# Patient Record
Sex: Female | Born: 1980 | Race: Black or African American | Hispanic: No | Marital: Single | Smoking: Never smoker
Health system: Southern US, Community
[De-identification: ages and names within clinical notes are randomized; demographics above are authoritative.]

---

## 2013-05-09 ENCOUNTER — Other Ambulatory Visit (HOSPITAL_COMMUNITY)
Admission: RE | Admit: 2013-05-09 | Discharge: 2013-05-09 | Disposition: A | Discharge status: Home / Self Care | Payer: BC Managed Care – PPO | Source: Ambulatory Visit | Attending: Family Medicine | Admitting: Family Medicine

## 2013-05-09 DIAGNOSIS — Z113 Encounter for screening for infections with a predominantly sexual mode of transmission: Secondary | ICD-10-CM | POA: Insufficient documentation

## 2013-05-09 DIAGNOSIS — Z124 Encounter for screening for malignant neoplasm of cervix: Secondary | ICD-10-CM | POA: Insufficient documentation

## 2016-10-10 DIAGNOSIS — M791 Myalgia: Secondary | ICD-10-CM | POA: Insufficient documentation

## 2016-10-10 DIAGNOSIS — R509 Fever, unspecified: Secondary | ICD-10-CM | POA: Insufficient documentation

## 2016-10-11 ENCOUNTER — Emergency Department (HOSPITAL_COMMUNITY)
Admission: EM | Admit: 2016-10-11 | Discharge: 2016-10-11 | Disposition: A | Discharge status: Home / Self Care | Payer: Self-pay | Attending: Emergency Medicine | Admitting: Emergency Medicine

## 2016-10-11 ENCOUNTER — Encounter (HOSPITAL_COMMUNITY): Payer: Self-pay | Admitting: *Deleted

## 2016-10-11 ENCOUNTER — Emergency Department (HOSPITAL_COMMUNITY): Payer: Self-pay

## 2016-10-11 DIAGNOSIS — R6889 Other general symptoms and signs: Secondary | ICD-10-CM

## 2016-10-11 LAB — PREGNANCY, URINE: PREG TEST UR: NEGATIVE

## 2016-10-11 MED ORDER — ONDANSETRON 4 MG PO TBDP
4.0000 mg | ORAL_TABLET | Freq: Once | ORAL | Status: AC
  Administered 2016-10-11: 4 mg via ORAL
  Filled 2016-10-11: qty 1

## 2016-10-11 MED ORDER — ACETAMINOPHEN 325 MG PO TABS
650.0000 mg | ORAL_TABLET | Freq: Once | ORAL | Status: AC
Start: 2016-10-11 — End: 2016-10-11
  Administered 2016-10-11: 650 mg via ORAL
  Filled 2016-10-11: qty 2

## 2016-10-11 NOTE — ED Triage Notes (Signed)
Patient is alert and oriented x4. She is complaining of generalized body aches with fever.  Patient unable to determine fever without a thermometer.  Currently she rates her pain 8 of 10 with nausea.  Patient states that she has been around her niece that has been sick.

## 2016-10-11 NOTE — ED Provider Notes (Signed)
[facility]-EMERGENCY DEPT Provider Note   CSN: [id number] Arrival date & time: 10/10/16  2357    By signing my name below, I, Valentino SaxonBianca Contreras, attest that this documentation has been prepared under the direction and in the presence of Pricilla LovelessScott Cormick Moss, MD. Electronically Signed: Valentino SaxonBianca Contreras, ED Scribe. 10/11/16. 4:08 AM.  History   Chief Complaint Chief Complaint  Patient presents with  . Influenza   The history is provided by the patient. No language interpreter was used.   HPI Comments: Bonnie Martinez is a 36 y.o. female with no pertinent PMHx, who presents to the Emergency Department complaining of moderate, constant, generalized muscle aches onset five days ago. Pt reports associated CP, congestion, sore throat, subjective fever and appetite change accompanied by nausea. Pt's temperature in the ED was 99.9. Pt notes the Tylenol given in ED had minimal relief. She denies use of OTC medication at home for relief. Denies vomiting, cough, abdominal pain, HA. Per nurse note, pt states she has been around her sick niece at home.   History reviewed. No pertinent past medical history.  There are no active problems to display for this patient.   History reviewed. No pertinent surgical history.  OB History    No data available       Home Medications    Prior to Admission medications   Not on File    Family History History reviewed. No pertinent family history.  Social History Social History  Substance Use Topics  . Smoking status: Never Smoker  . Smokeless tobacco: Never Used  . Alcohol use Yes     Allergies   Patient has no known allergies.   Review of Systems Review of Systems  Constitutional: Positive for appetite change and fever.  HENT: Positive for congestion and sore throat.   Respiratory: Positive for cough.   Cardiovascular: Positive for chest pain.  Gastrointestinal: Positive for nausea. Negative for abdominal pain and vomiting.  Musculoskeletal:  Positive for myalgias.  Neurological: Negative for headaches.  All other systems reviewed and are negative.    Physical Exam Updated Vital Signs BP 107/75 (BP Location: Left Arm)   Pulse 87   Temp 100.6 F (38.1 C) (Oral)   Resp 18   Ht 5' 5.5" (1.664 m)   Wt 195 lb (88.5 kg)   LMP 10/07/2016   SpO2 96%   BMI 31.96 kg/m   Physical Exam  Constitutional: She is oriented to person, place, and time. She appears well-developed and well-nourished.  HENT:  Head: Normocephalic and atraumatic.  Right Ear: External ear normal.  Left Ear: External ear normal.  Nose: Nose normal.  Eyes: Right eye exhibits no discharge. Left eye exhibits no discharge.  Cardiovascular: Normal rate, regular rhythm and normal heart sounds.   Pulmonary/Chest: Effort normal and breath sounds normal. She has no wheezes. She exhibits tenderness (mild over strernum).  Abdominal: Soft. There is no tenderness.  Neurological: She is alert and oriented to person, place, and time.  Skin: Skin is warm and dry.  Nursing note and vitals reviewed.    ED Treatments / Results   DIAGNOSTIC STUDIES: Oxygen Saturation is 98% on RA, normal by my interpretation.    COORDINATION OF CARE: 3:54 AM Discussed treatment plan with pt at bedside which includes labs, chest imaging and analgesicand pt agreed to plan.   Labs (all labs ordered are listed, but only abnormal results are displayed) Labs Reviewed  PREGNANCY, URINE  POC URINE PREG, ED    EKG  EKG Interpretation None  Radiology Dg Chest 2 View  Result Date: 10/11/2016 CLINICAL DATA:  Cough, fever, body aches and chest congestion. EXAM: CHEST  2 VIEW COMPARISON:  None. FINDINGS: Cardiomediastinal silhouette is normal. No pleural effusions or focal consolidations. Trachea projects midline and there is no pneumothorax. Soft tissue planes and included osseous structures are non-suspicious. IMPRESSION: Normal chest. Electronically Signed   By: Awilda Metro M.D.   On: 10/11/2016 04:10    Procedures Procedures (including critical care time)  Medications Ordered in ED Medications  acetaminophen (TYLENOL) tablet 650 mg (650 mg Oral Given 10/11/16 0016)  ondansetron (ZOFRAN-ODT) disintegrating tablet 4 mg (4 mg Oral Given 10/11/16 0417)     Initial Impression / Assessment and Plan / ED Course  I have reviewed the triage vital signs and the nursing notes.  Pertinent labs & imaging results that were available during my care of the patient were reviewed by me and considered in my medical decision making (see chart for details).  Clinical Course as of Oct 11 654  Wed Oct 11, 2016  0354 Will get Xray to r/o PNA. Most likely influenza. No high risk features, sx now over 72 hours no indication for tamiflu. Zofran for nausea. Appears adequately hydrated.  [SG]  0527 No obvious pneumonia. Given steady state for 5 days this is most likely flu. Over 72 hours, no indication for tamiflu. Appears hydrated. Oral fluids, tylenol/ibuprofen, return precautions.  [SG]    Clinical Course User Index [SG] Pricilla Loveless, MD    Final Clinical Impressions(s) / ED Diagnoses   Final diagnoses:  Flu-like symptoms    New Prescriptions There are no discharge medications for this patient.   I personally performed the services described in this documentation, which was scribed in my presence. The recorded information has been reviewed and is accurate.     Pricilla Loveless, MD 10/11/16 [phone]

## 2017-10-31 IMAGING — CR DG CHEST 2V
2 series · 2 of 2 positions shown · non-contrast
Comparison: None.

CLINICAL DATA: Cough, fever, body aches and chest congestion.

EXAM:
CHEST  2 VIEW

[w chest pa]
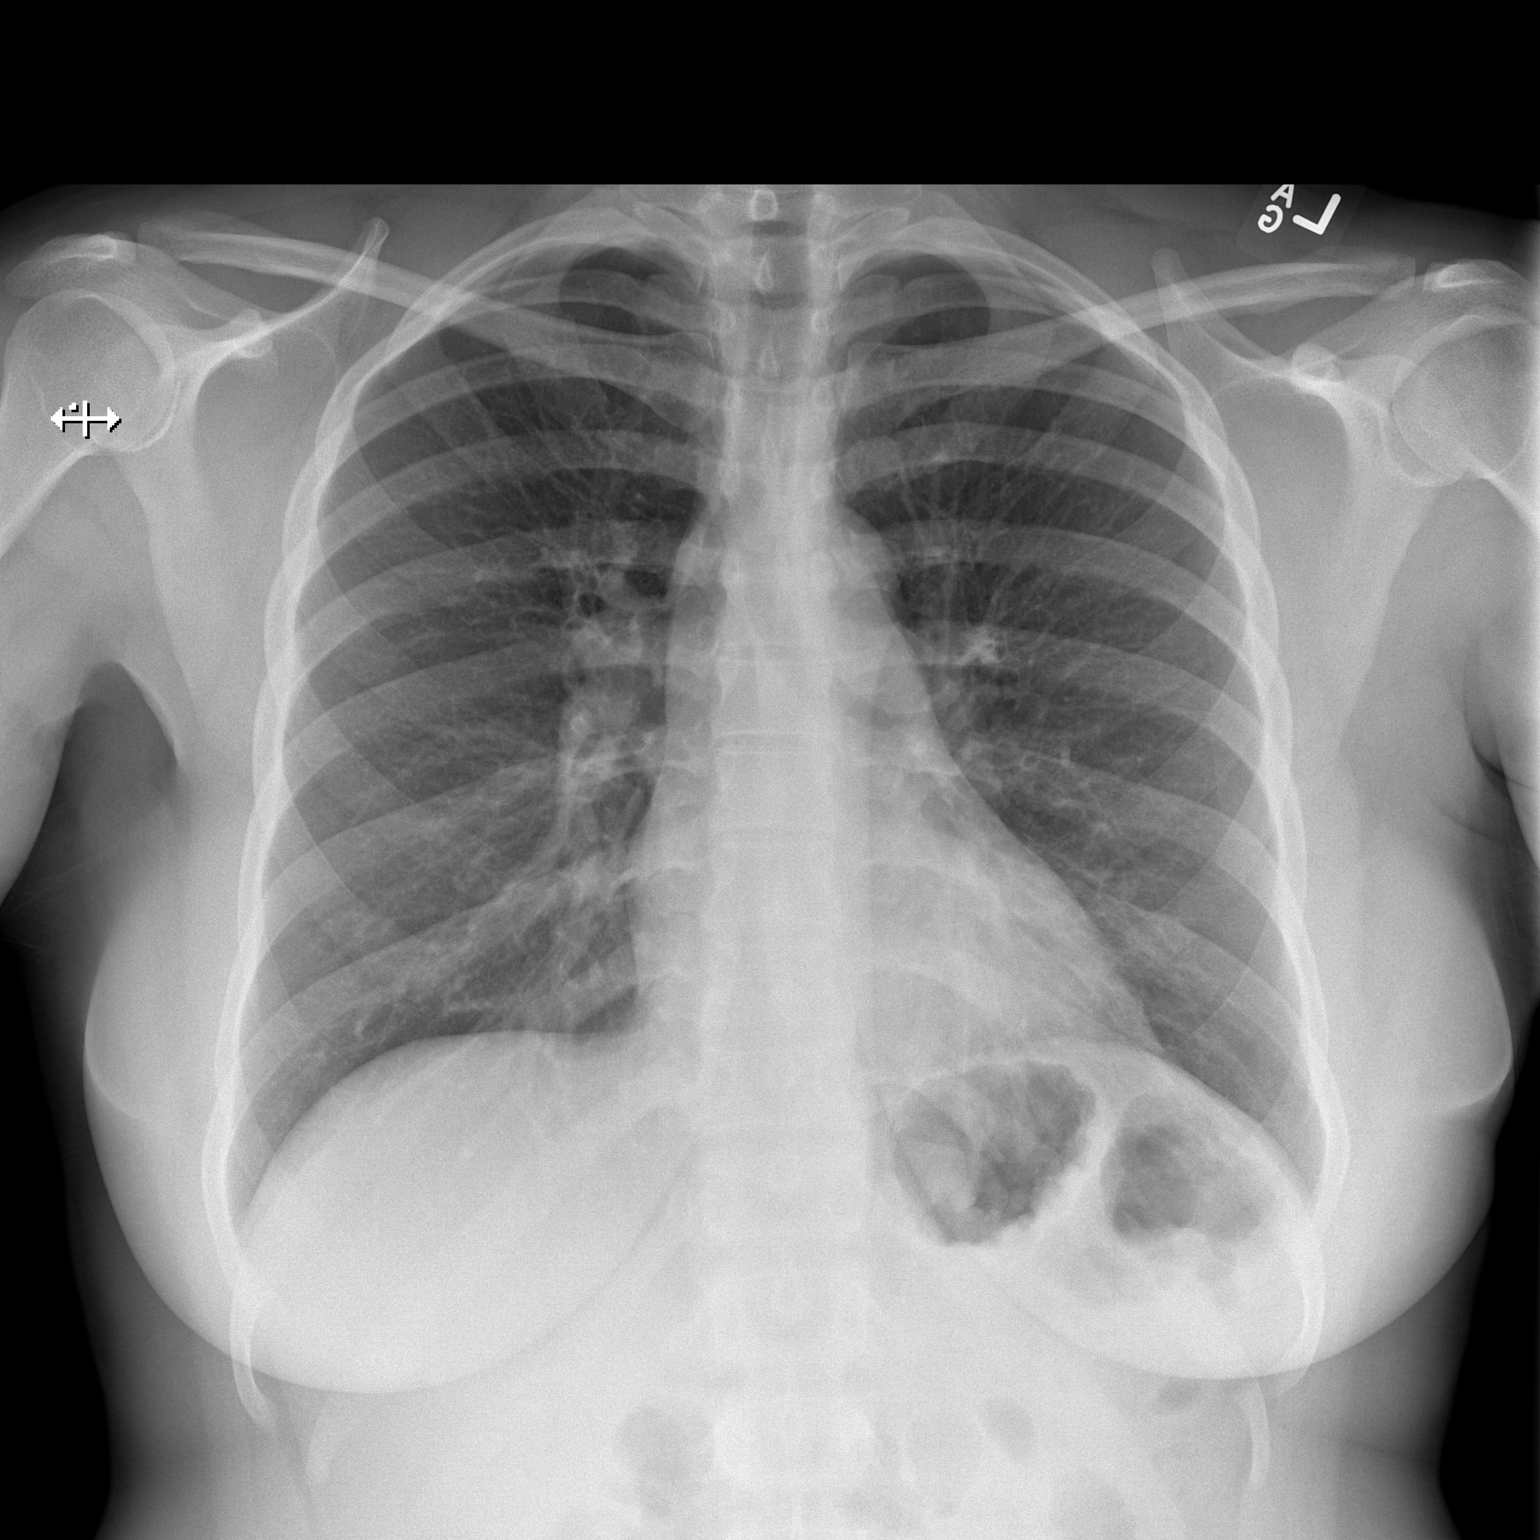

[w chest lat]
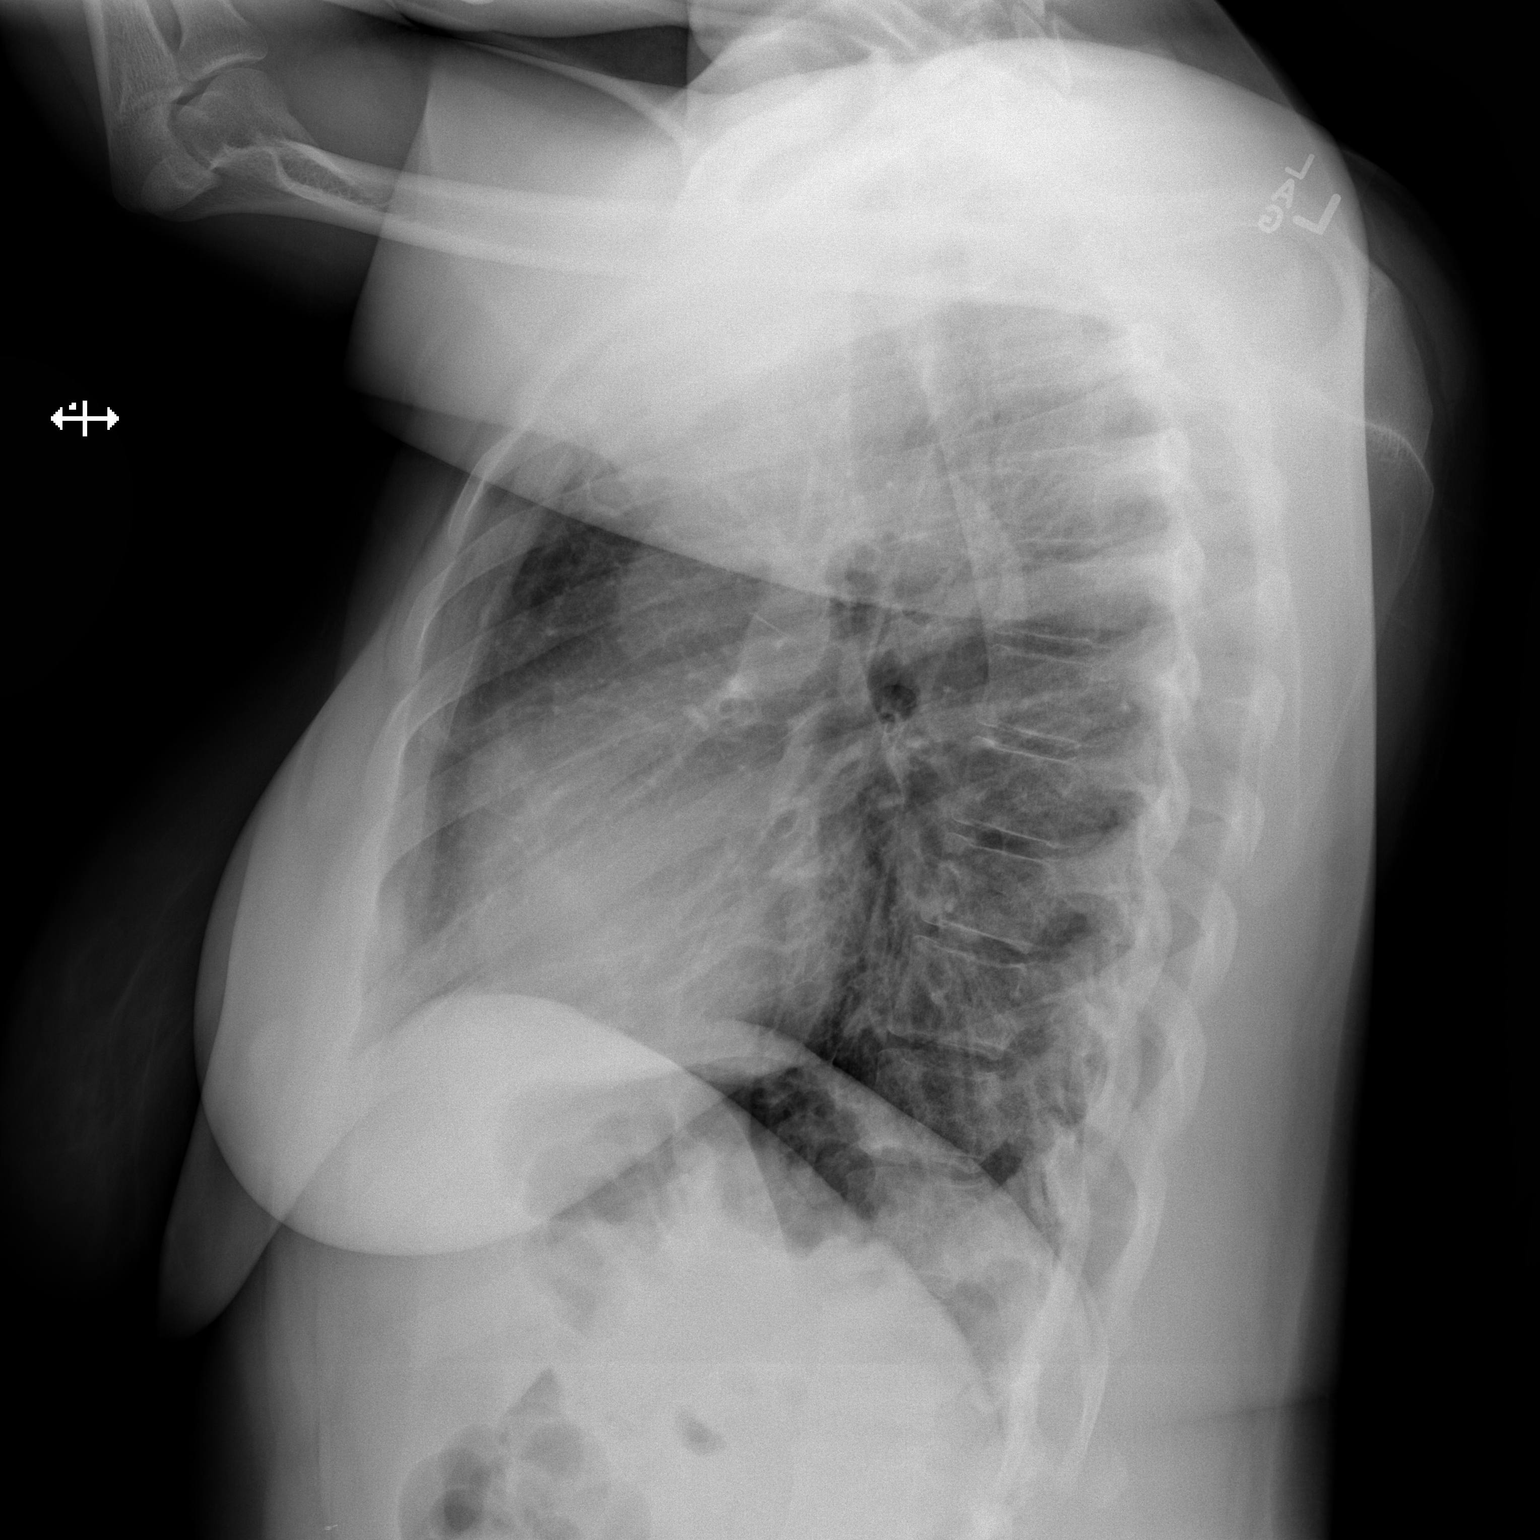

[2 of 2 positions shown; findings below may reference images not displayed]

FINDINGS: Cardiomediastinal silhouette is normal. No pleural effusions or
focal consolidations. Trachea projects midline and there is no
pneumothorax. Soft tissue planes and included osseous structures are
non-suspicious.
IMPRESSION: Normal chest.

## 2022-06-22 ENCOUNTER — Other Ambulatory Visit (HOSPITAL_COMMUNITY): Payer: Self-pay
